# Patient Record
Sex: Female | Born: 1980 | Race: White | Hispanic: No | Marital: Single
Health system: Southern US, Community
[De-identification: ages and names within clinical notes are randomized; demographics above are authoritative.]

## PROBLEM LIST (undated history)

## (undated) DIAGNOSIS — E119 Type 2 diabetes mellitus without complications: Secondary | ICD-10-CM

## (undated) DIAGNOSIS — E669 Obesity, unspecified: Secondary | ICD-10-CM

## (undated) DIAGNOSIS — I10 Essential (primary) hypertension: Secondary | ICD-10-CM

## (undated) HISTORY — PX: TONSILLECTOMY: SUR1361

## (undated) HISTORY — PX: ADENOIDECTOMY: SUR15

---

## 2015-08-09 ENCOUNTER — Encounter: Payer: Self-pay | Admitting: Emergency Medicine

## 2015-08-09 ENCOUNTER — Ambulatory Visit: Payer: Medicare Other

## 2015-08-09 ENCOUNTER — Ambulatory Visit
Admission: EM | Admit: 2015-08-09 | Discharge: 2015-08-09 | Disposition: A | Discharge status: Home / Self Care | Payer: Medicare Other | Attending: Family Medicine | Admitting: Family Medicine

## 2015-08-09 DIAGNOSIS — Z7984 Long term (current) use of oral hypoglycemic drugs: Secondary | ICD-10-CM | POA: Diagnosis not present

## 2015-08-09 DIAGNOSIS — W19XXXA Unspecified fall, initial encounter: Secondary | ICD-10-CM | POA: Diagnosis not present

## 2015-08-09 DIAGNOSIS — S42202A Unspecified fracture of upper end of left humerus, initial encounter for closed fracture: Secondary | ICD-10-CM | POA: Diagnosis not present

## 2015-08-09 DIAGNOSIS — I10 Essential (primary) hypertension: Secondary | ICD-10-CM | POA: Insufficient documentation

## 2015-08-09 DIAGNOSIS — Z79899 Other long term (current) drug therapy: Secondary | ICD-10-CM | POA: Insufficient documentation

## 2015-08-09 DIAGNOSIS — E119 Type 2 diabetes mellitus without complications: Secondary | ICD-10-CM | POA: Insufficient documentation

## 2015-08-09 DIAGNOSIS — F819 Developmental disorder of scholastic skills, unspecified: Secondary | ICD-10-CM | POA: Insufficient documentation

## 2015-08-09 DIAGNOSIS — M79602 Pain in left arm: Secondary | ICD-10-CM | POA: Diagnosis present

## 2015-08-09 DIAGNOSIS — E669 Obesity, unspecified: Secondary | ICD-10-CM | POA: Insufficient documentation

## 2015-08-09 DIAGNOSIS — Z880 Allergy status to penicillin: Secondary | ICD-10-CM | POA: Diagnosis not present

## 2015-08-09 HISTORY — DX: Type 2 diabetes mellitus without complications: E11.9

## 2015-08-09 HISTORY — DX: Obesity, unspecified: E66.9

## 2015-08-09 HISTORY — DX: Essential (primary) hypertension: I10

## 2015-08-09 MED ORDER — HYDROCODONE-ACETAMINOPHEN 5-325 MG PO TABS: 1.0000 | ORAL_TABLET | Freq: Four times a day (QID) | ORAL | Status: AC | PRN

## 2015-08-09 NOTE — ED Provider Notes (Signed)
CSN: [id number]     Arrival date & time 08/09/15  1336 History   First MD Initiated Contact with Patient 08/09/15 1430     Chief Complaint  Patient presents with  . Shoulder Injury   (Consider location/radiation/quality/duration/timing/severity/associated sxs/prior Treatment) HPI Comments: 35 yo female with a h/o diabetes, learning disability presents with father with a complaint of left shoulder and left arm pain after falling at [facility] and landing on her left shoulder/arm. Patient states she lost her balance. She also hit her forehead against a wood counter, but denies any loss of consciousness, vision changes, vomiting. Denies any numbness or tingling of her left arm.  The history is provided by the patient.    Past Medical History  Diagnosis Date  . Diabetes mellitus without complication (HCC)   . Hypertension   . Obesity    Past Surgical History  Procedure Laterality Date  . Tonsillectomy    . Adenoidectomy     History reviewed. No pertinent family history. Social History  Substance Use Topics  . Smoking status: None  . Smokeless tobacco: None  . Alcohol Use: None   OB History    No data available     Review of Systems  Allergies  Penicillins  Home Medications   Prior to Admission medications   Medication Sig Start Date End Date Taking? Authorizing Provider  betamethasone dipropionate (DIPROLENE) 0.05 % cream Apply topically 2 (two) times daily.   Yes Historical Provider, MD  furosemide (LASIX) 20 MG tablet Take 20 mg by mouth daily.   Yes Historical Provider, MD  levonorgestrel-ethinyl estradiol (ENPRESSE,TRIVORA) tablet Take 1 tablet by mouth daily.   Yes Historical Provider, MD  lisinopril (PRINIVIL,ZESTRIL) 20 MG tablet Take 20 mg by mouth daily.   Yes Historical Provider, MD  metFORMIN (GLUCOPHAGE) 500 MG tablet Take 500 mg by mouth 3 (three) times daily.   Yes Historical Provider, MD  pioglitazone (ACTOS) 45 MG tablet Take 45 mg by mouth daily.   Yes  Historical Provider, MD  Potassium Chloride (KLOR-CON PO) Take 20 mEq by mouth 2 (two) times daily.   Yes Historical Provider, MD  HYDROcodone-acetaminophen (NORCO/VICODIN) 5-325 MG tablet Take 1-2 tablets by mouth every 6 (six) hours as needed. 08/09/15   Shelby Mccallum, MD   Meds Ordered and Administered this Visit  Medications - No data to display  BP 105/55 mmHg  Pulse 122  Temp(Src) 98.6 F (37 C) (Oral)  Ht  (1.27 m)  Wt 326 lb (147.873 kg)  BMI 91.68 kg/m2  SpO2 98%  LMP 08/02/2015 No data found.   Physical Exam  Constitutional: She is oriented to person, place, and time. She appears well-developed and well-nourished. No distress.  Musculoskeletal: She exhibits tenderness.       Left shoulder: She exhibits decreased range of motion, tenderness, bony tenderness (along the mid to proximal  humerus), swelling and pain. She exhibits no effusion, no crepitus, no deformity, no laceration, no spasm, normal pulse and normal strength.       Left elbow: She exhibits normal range of motion, no swelling, no effusion, no deformity and no laceration. Tenderness found.       Arms: Left upper extremity neurovascularly intact, including normal radial nerve examination  Neurological: She is alert and oriented to person, place, and time. She has normal reflexes. She displays normal reflexes. No cranial nerve deficit. She exhibits normal muscle tone. Coordination normal.  Skin: No rash noted. She is not diaphoretic.  Vitals reviewed.  ED Course  Procedures (including critical care time)  Labs Review Labs Reviewed - No data to display  Imaging Review Dg Elbow Complete Left  08/09/2015  CLINICAL DATA:  Recent fall with left arm pain, initial encounter EXAM: LEFT ELBOW - COMPLETE 3+ VIEW COMPARISON:  None. FINDINGS: There is no evidence of fracture, dislocation, or joint effusion. There is no evidence of arthropathy or other focal bone abnormality. Soft tissues are unremarkable.  IMPRESSION: No acute abnormality noted. Electronically Signed   By: Alcide CleverMark  Lukens M.D.   On: 08/09/2015 15:15   Dg Shoulder Left  08/09/2015  CLINICAL DATA:  Fall on left arm with pain EXAM: LEFT SHOULDER - 2+ VIEW COMPARISON:  None. FINDINGS: There is an oblique fracture through the proximal shaft of the left humerus just below the surgical neck. It may also the an undisplaced fracture through the greater tuberosity. No dislocation is seen. No other focal abnormality is noted. IMPRESSION: Oblique fracture through the proximal left humerus. Possible undisplaced greater tuberosity fracture is noted. Electronically Signed   By: Alcide CleverMark  Lukens M.D.   On: 08/09/2015 15:16     Visual Acuity Review  Right Eye Distance:   Left Eye Distance:   Bilateral Distance:    Right Eye Near:   Left Eye Near:    Bilateral Near:         MDM   1. Proximal humerus fracture, left, closed, initial encounter     Discharge Medication List as of 08/09/2015  4:24 PM    START taking these medications   Details  HYDROcodone-acetaminophen (NORCO/VICODIN) 5-325 MG tablet Take 1-2 tablets by mouth every 6 (six) hours as needed., Starting 08/09/2015, Until Discontinued, Print       1. x-ray results and diagnosis reviewed with patient and parent 2. rx as per orders above; reviewed possible side effects, interactions, risks and benefits  3. "sugar tong" splint applied to humerus and sling applied for immobilization and support 4. Recommend supportive treatment with otc analgesics 5. Follow-up with Dr. Joice LoftsPoggi [facility]( Orthopedics) in 3-4 days; Dr. Joice LoftsPoggi on call today and case discussed/reviewed with him through his assistant as Dr. Joice LoftsPoggi currently in surgery/OR  Shelby Mccallumrlando Jaun Galluzzo, MD 08/09/15 [phone]

## 2015-08-09 NOTE — Discharge Instructions (Signed)
Humerus Fracture Treated With Immobilization °The humerus is the large bone in your upper arm. You have a broken (fractured) humerus. These fractures are easily diagnosed with X-rays. °TREATMENT  °Simple fractures which will heal without disability are treated with simple immobilization. Immobilization means you will wear a cast, splint, or sling. You have a fracture which will do well with immobilization. The fracture will heal well simply by being held in a good position until it is stable enough to begin range of motion exercises. Do not take part in activities which would further injure your arm.  °HOME CARE INSTRUCTIONS  °· Put ice on the injured area. °¨ Put ice in a plastic bag. °¨ Place a towel between your skin and the bag. °¨ Leave the ice on for 15-20 minutes, 03-04 times a day. °· If you have a cast: °¨ Do not scratch the skin under the cast using sharp or pointed objects. °¨ Check the skin around the cast every day. You may put lotion on any red or sore areas. °¨ Keep your cast dry and clean. °· If you have a splint: °¨ Wear the splint as directed. °¨ Keep your splint dry and clean. °¨ You may loosen the elastic around the splint if your fingers become numb, tingle, or turn cold or blue. °· If you have a sling: °¨ Wear the sling as directed. °· Do not put pressure on any part of your cast or splint until it is fully hardened. °· Your cast or splint can be protected during bathing with a plastic bag. Do not lower the cast or splint into water. °· Only take over-the-counter or prescription medicines for pain, discomfort, or fever as directed by your caregiver. °· Do range of motion exercises as instructed by your caregiver. °· Follow up as directed by your caregiver. This is very important in order to avoid permanent injury or disability and chronic pain. °SEEK IMMEDIATE MEDICAL CARE IF:  °· Your skin or nails in the injured arm turn blue or gray. °· Your arm feels cold or numb. °· You develop severe pain  in the injured arm. °· You are having problems with the medicines you were given. °MAKE SURE YOU:  °· Understand these instructions. °· Will watch your condition. °· Will get help right away if you are not doing well or get worse. °  °This information is not intended to replace advice given to you by your health care provider. Make sure you discuss any questions you have with your health care provider. °  °Document Released: 06/07/2000 Document Revised: 03/22/2014 Document Reviewed: 07/24/2014 °Elsevier Interactive Patient Education ©2016 [employer]. ° °

## 2015-08-09 NOTE — ED Notes (Signed)
[facility]. Patient stated hit forehead and landed on left shoulder. Patient has lump on forehead and left shoulder pain. Patient stated pain in shoulder and unable to mobilze.Patinet denies head ache or dizziness.

## 2017-01-24 IMAGING — CR DG SHOULDER 2+V*L*
2 series · 2 of 2 positions shown · non-contrast
Comparison: None.

CLINICAL DATA: Fall on left arm with pain

EXAM:
LEFT SHOULDER - 2+ VIEW

[shoulder grashey]
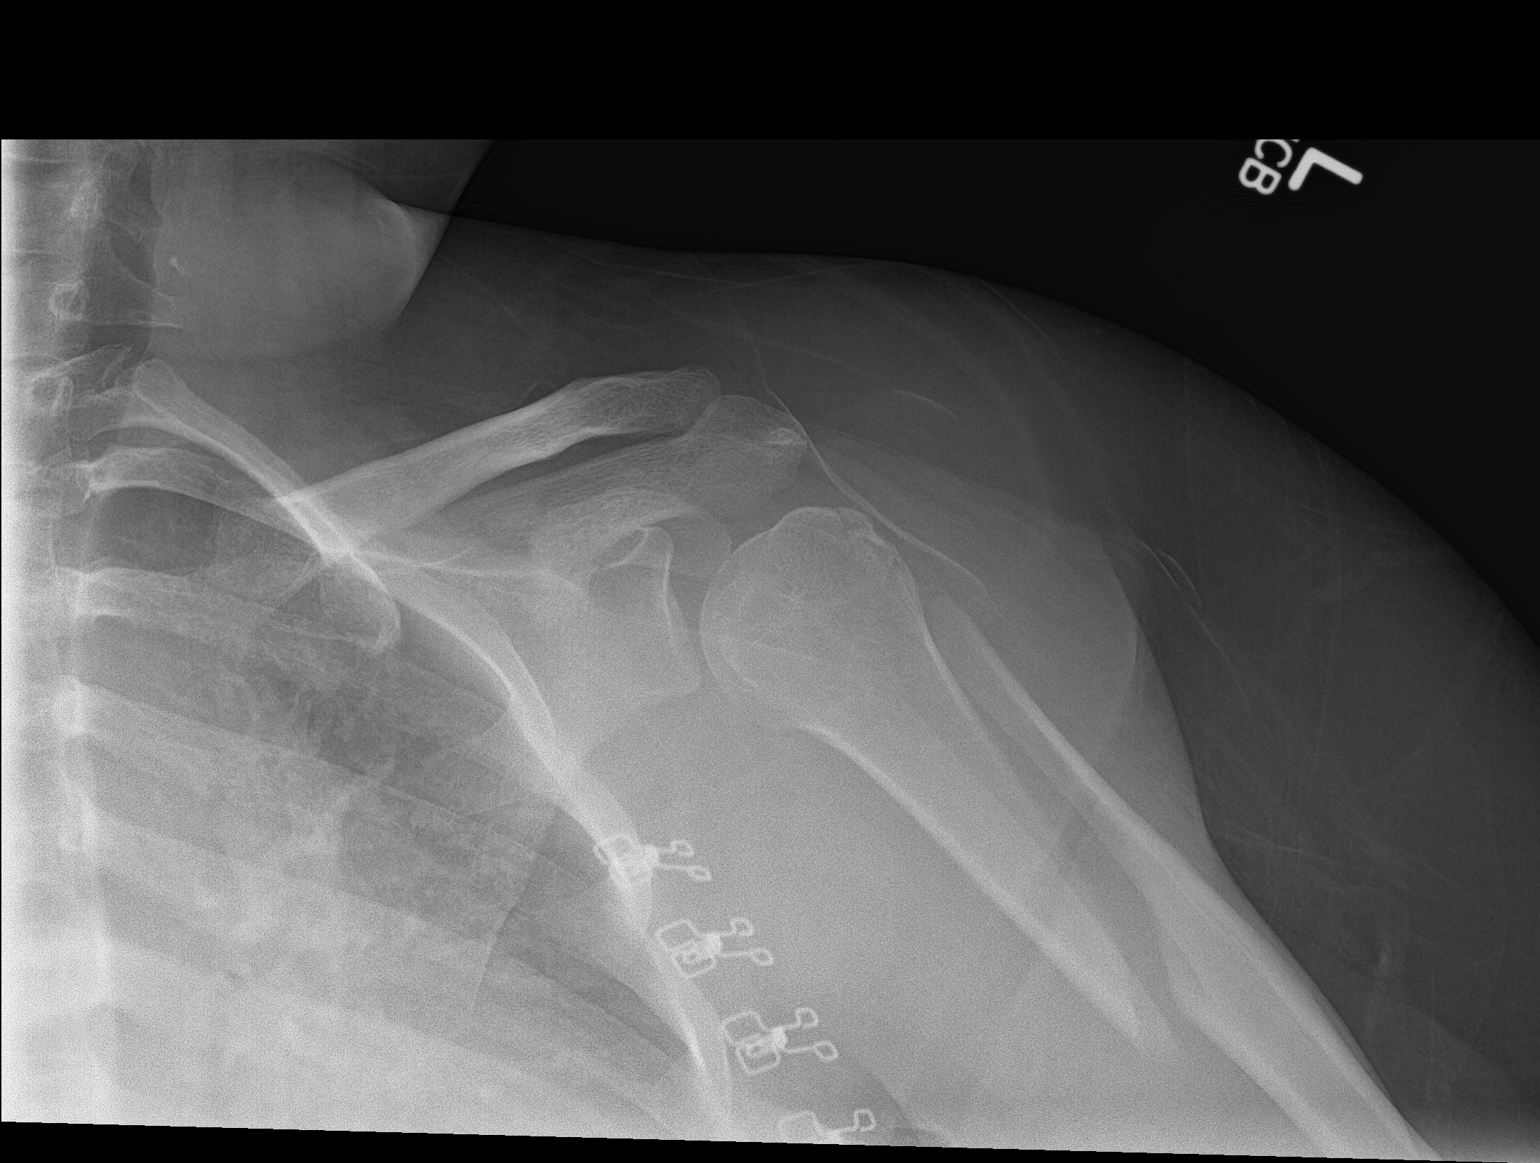

[shoulder y view]
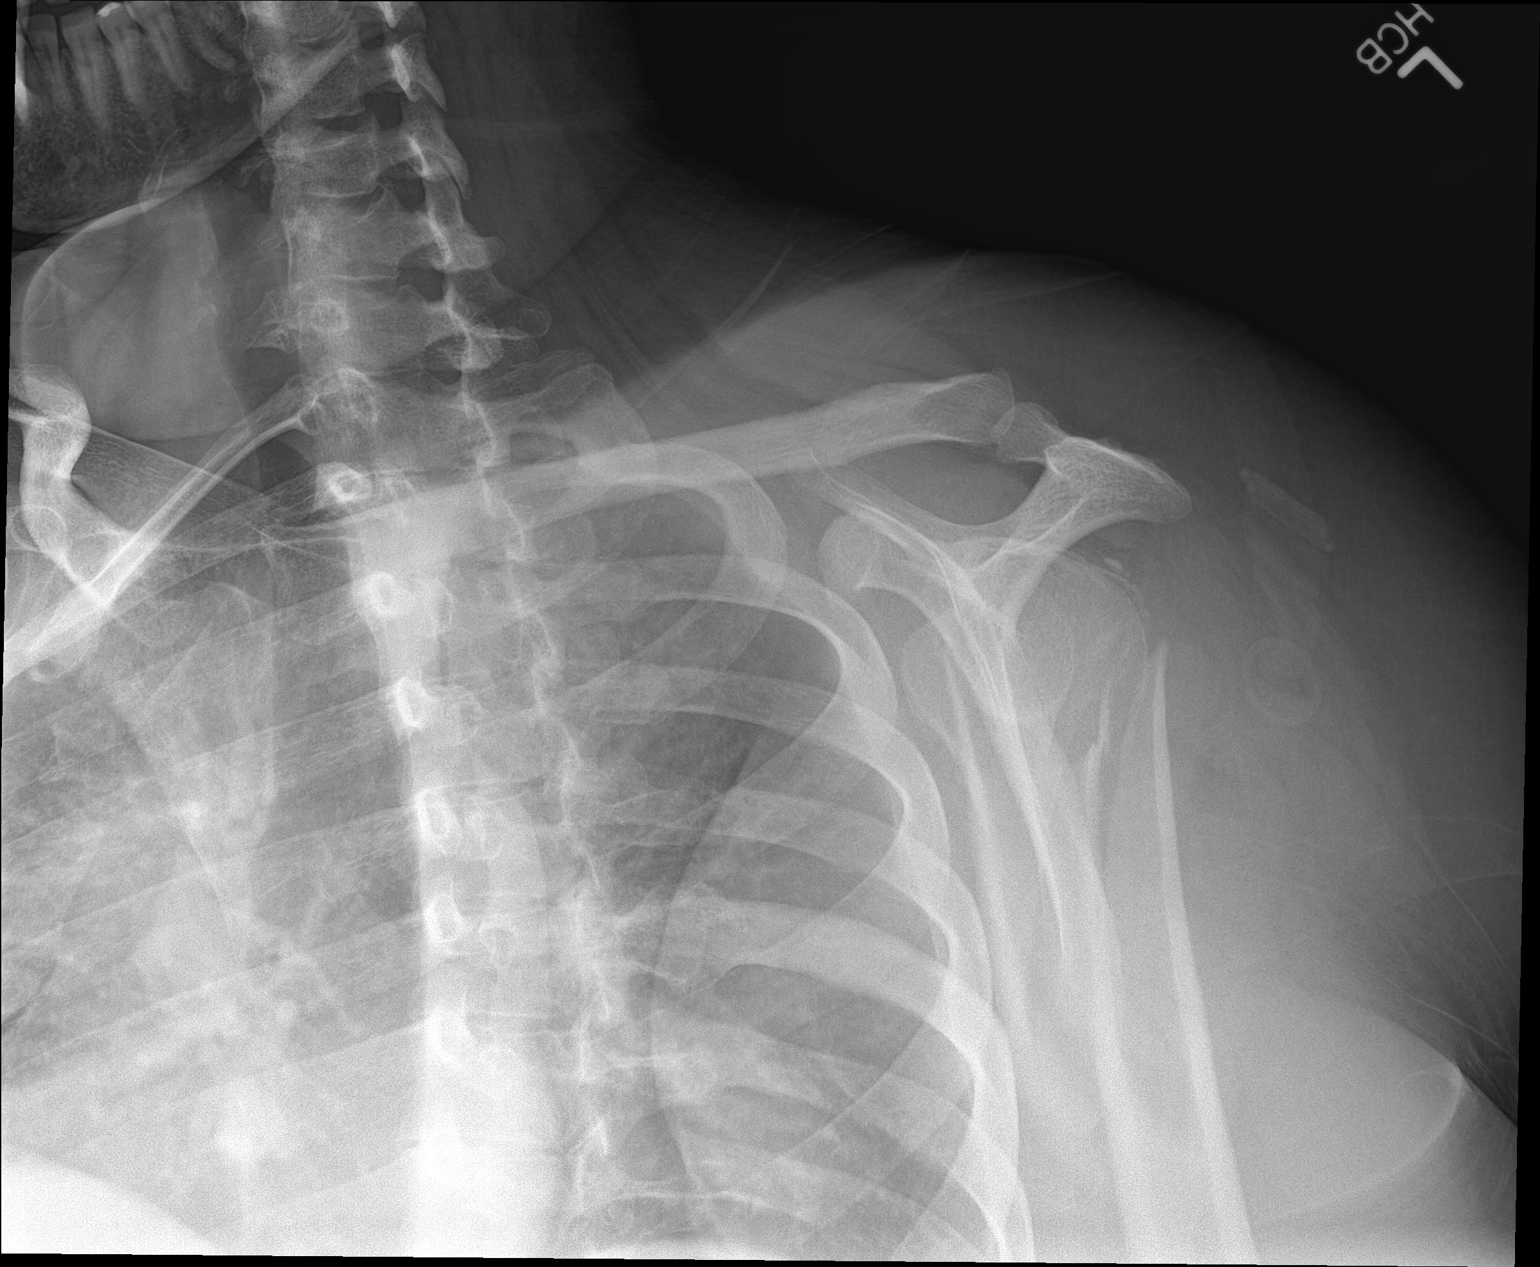

[2 of 2 positions shown; findings below may reference images not displayed]

FINDINGS: There is an oblique fracture through the proximal shaft of the left
humerus just below the surgical neck. It may also the an undisplaced
fracture through the greater tuberosity. No dislocation is seen. No
other focal abnormality is noted.
IMPRESSION: Oblique fracture through the proximal left humerus. Possible
undisplaced greater tuberosity fracture is noted.

## 2019-06-28 ENCOUNTER — Ambulatory Visit: Payer: Medicare Other

## 2019-07-27 ENCOUNTER — Ambulatory Visit: Payer: Medicare Other | Attending: Internal Medicine

## 2019-07-27 ENCOUNTER — Other Ambulatory Visit: Payer: Self-pay

## 2019-07-27 DIAGNOSIS — Z23 Encounter for immunization: Secondary | ICD-10-CM

## 2019-07-27 NOTE — Progress Notes (Signed)
   Covid-19 Vaccination Clinic  Name:  [name]    MRN: [id number] DOB: 02-03-1981  07/27/2019  [name] was observed post Covid-19 immunization for 15 minutes without incident. She was provided with Vaccine Information Sheet and instruction to access the V-Safe system.   [name] was instructed to call 911 with any severe reactions post vaccine: Marland Kitchen Difficulty breathing  . Swelling of face and throat  . A fast heartbeat  . A bad rash all over body  . Dizziness and weakness   Immunizations Administered    Name Date Dose VIS Date Route   Pfizer COVID-19 Vaccine 07/27/2019  9:30 AM 0.3 mL 05/09/2018 Intramuscular   Manufacturer: [employer], [employer]   Lot: [id number]   NDC: [address]-9

## 2019-08-21 ENCOUNTER — Ambulatory Visit: Payer: Medicare Other | Attending: Internal Medicine

## 2019-08-21 DIAGNOSIS — Z23 Encounter for immunization: Secondary | ICD-10-CM

## 2019-08-21 NOTE — Progress Notes (Signed)
   Covid-19 Vaccination Clinic  Name:  Shelby Bauer    MRN: [id number] DOB: 08-08-1980  08/21/2019  [name] was observed post Covid-19 immunization for 15 minutes without incident. She was provided with Vaccine Information Sheet and instruction to access the V-Safe system.   [name] was instructed to call 911 with any severe reactions post vaccine: Marland Kitchen Difficulty breathing  . Swelling of face and throat  . A fast heartbeat  . A bad rash all over body  . Dizziness and weakness   Immunizations Administered    Name Date Dose VIS Date Route   Pfizer COVID-19 Vaccine 08/21/2019  9:31 AM 0.3 mL 05/09/2018 Intramuscular   Manufacturer: [employer], [employer]   Lot: [id number]   NDC: [address]-1

## 2022-02-03 ENCOUNTER — Other Ambulatory Visit
Admission: RE | Admit: 2022-02-03 | Discharge: 2022-02-03 | Disposition: A | Discharge status: Home / Self Care | Payer: Medicare Other | Source: Ambulatory Visit | Attending: Family Medicine | Admitting: Family Medicine

## 2022-02-03 DIAGNOSIS — M79605 Pain in left leg: Secondary | ICD-10-CM | POA: Insufficient documentation

## 2022-02-03 DIAGNOSIS — Z23 Encounter for immunization: Secondary | ICD-10-CM | POA: Diagnosis present

## 2022-02-03 DIAGNOSIS — E119 Type 2 diabetes mellitus without complications: Secondary | ICD-10-CM | POA: Insufficient documentation

## 2022-02-03 LAB — D-DIMER, QUANTITATIVE: D-Dimer, Quant: 0.6 ug/mL-FEU — ABNORMAL HIGH (ref 0.00–0.50)

## 2022-06-11 ENCOUNTER — Emergency Department: Payer: Medicare Other

## 2022-06-11 ENCOUNTER — Emergency Department
Admission: EM | Admit: 2022-06-11 | Discharge: 2022-06-11 | Disposition: A | Discharge status: Other Acute Inpatient Hospital | Payer: Medicare Other | Attending: Emergency Medicine | Admitting: Emergency Medicine

## 2022-06-11 ENCOUNTER — Other Ambulatory Visit: Payer: Self-pay

## 2022-06-11 DIAGNOSIS — K420 Umbilical hernia with obstruction, without gangrene: Secondary | ICD-10-CM

## 2022-06-11 DIAGNOSIS — I10 Essential (primary) hypertension: Secondary | ICD-10-CM | POA: Insufficient documentation

## 2022-06-11 DIAGNOSIS — E119 Type 2 diabetes mellitus without complications: Secondary | ICD-10-CM | POA: Diagnosis not present

## 2022-06-11 DIAGNOSIS — K56609 Unspecified intestinal obstruction, unspecified as to partial versus complete obstruction: Secondary | ICD-10-CM | POA: Insufficient documentation

## 2022-06-11 DIAGNOSIS — Z6841 Body Mass Index (BMI) 40.0 and over, adult: Secondary | ICD-10-CM | POA: Diagnosis not present

## 2022-06-11 DIAGNOSIS — R112 Nausea with vomiting, unspecified: Secondary | ICD-10-CM | POA: Diagnosis present

## 2022-06-11 LAB — COMPREHENSIVE METABOLIC PANEL
ALT: 99 U/L — ABNORMAL HIGH (ref 0–44)
AST: 86 U/L — ABNORMAL HIGH (ref 15–41)
Albumin: 4 g/dL (ref 3.5–5.0)
Alkaline Phosphatase: 85 U/L (ref 38–126)
Anion gap: 18 — ABNORMAL HIGH (ref 5–15)
BUN: 21 mg/dL — ABNORMAL HIGH (ref 6–20)
CO2: 26 mmol/L (ref 22–32)
Calcium: 9.8 mg/dL (ref 8.9–10.3)
Chloride: 86 mmol/L — ABNORMAL LOW (ref 98–111)
Creatinine, Ser: 0.99 mg/dL (ref 0.44–1.00)
GFR, Estimated: >60 mL/min (ref >60)
Glucose, Bld: 169 mg/dL — ABNORMAL HIGH (ref 70–99)
Potassium: 3.7 mmol/L (ref 3.5–5.1)
Sodium: 130 mmol/L — ABNORMAL LOW (ref 135–145)
Total Bilirubin: 1.1 mg/dL (ref 0.3–1.2)
Total Protein: 7.8 g/dL (ref 6.5–8.1)

## 2022-06-11 LAB — CBC WITH DIFFERENTIAL/PLATELET
Abs Immature Granulocytes: 0.06 10*3/uL (ref 0.00–0.07)
Basophils Absolute: 0 10*3/uL (ref 0.0–0.1)
Basophils Relative: 0 %
Eosinophils Absolute: 0.1 10*3/uL (ref 0.0–0.5)
Eosinophils Relative: 1 %
HCT: 50.3 % — ABNORMAL HIGH (ref 36.0–46.0)
Hemoglobin: 16 g/dL — ABNORMAL HIGH (ref 12.0–15.0)
Immature Granulocytes: 0 %
Lymphocytes Relative: 15 %
Lymphs Abs: 2 10*3/uL (ref 0.7–4.0)
MCH: 26.1 pg (ref 26.0–34.0)
MCHC: 31.8 g/dL (ref 30.0–36.0)
MCV: 82.2 fL (ref 80.0–100.0)
Monocytes Absolute: 1.2 10*3/uL — ABNORMAL HIGH (ref 0.1–1.0)
Monocytes Relative: 9 %
Neutro Abs: 10.2 10*3/uL — ABNORMAL HIGH (ref 1.7–7.7)
Neutrophils Relative %: 75 %
Platelets: 638 10*3/uL — ABNORMAL HIGH (ref 150–400)
RBC: 6.12 MIL/uL — ABNORMAL HIGH (ref 3.87–5.11)
RDW: 14.6 % (ref 11.5–15.5)
WBC: 13.5 10*3/uL — ABNORMAL HIGH (ref 4.0–10.5)
nRBC: 0 % (ref 0.0–0.2)

## 2022-06-11 LAB — HCG, QUANTITATIVE, PREGNANCY: hCG, Beta Chain, Quant, S: <1 m[IU]/mL (ref <5)

## 2022-06-11 LAB — LIPASE, BLOOD: Lipase: 26 U/L (ref 11–51)

## 2022-06-11 MED ORDER — SODIUM CHLORIDE 0.9 % IV BOLUS
1000.0000 mL | Freq: Once | INTRAVENOUS | Status: AC
  Administered 2022-06-11: 1000 mL via INTRAVENOUS

## 2022-06-11 MED ORDER — IOHEXOL 300 MG/ML  SOLN
100.0000 mL | Freq: Once | INTRAMUSCULAR | Status: AC | PRN
  Administered 2022-06-11: 100 mL via INTRAVENOUS

## 2022-06-11 NOTE — ED Provider Notes (Signed)
[facility] Provider Note  Patient Contact: 6:02 PM (approximate)   History   Emesis   HPI  Shelby Bauer is a 42 y.o. female with a history of diabetes, hypertension and obesity, presents to the emergency department with nausea and vomiting for the past 5 days.  She states that she has not been able to keep anything down by mouth.  She denies fever and chills.  No prior abdominal surgeries.  She denies chest pain, chest tightness or shortness of breath.      Physical Exam   Triage Vital Signs: ED Triage Vitals  Enc Vitals Group     BP 06/11/22 1517 129/75     Pulse Rate 06/11/22 1517 (!) 120     Resp 06/11/22 1517 20     Temp 06/11/22 1517 98 F (36.7 C)     Temp src --      SpO2 06/11/22 1517 95 %     Weight 06/11/22 1518 (!) 347 lb (157.4 kg)     Height --      Head Circumference --      Peak Flow --      Pain Score 06/11/22 1518 6     Pain Loc --      Pain Edu? --      Excl. in [place]? --     Most recent vital signs: Vitals:   06/11/22 1517  BP: 129/75  Pulse: (!) 120  Resp: 20  Temp: 98 F (36.7 C)  SpO2: 95%     General: Alert and in no acute distress. Eyes:  PERRL. EOMI. Head: No acute traumatic findings ENT:      Nose: No congestion/rhinnorhea.      Mouth/Throat: Mucous membranes are moist. Neck: No stridor. No cervical spine tenderness to palpation. Hematological/Lymphatic/Immunilogical: No cervical lymphadenopathy. Cardiovascular:  Good peripheral perfusion Respiratory: Normal respiratory effort without tachypnea or retractions. Lungs CTAB. Good air entry to the bases with no decreased or absent breath sounds. Gastrointestinal: Bowel sounds 4 quadrants. Soft and nontender to palpation. No guarding or rigidity. No palpable masses. No distention. No CVA tenderness. Musculoskeletal: Full range of motion to all extremities.  Neurologic:  No gross focal neurologic deficits are appreciated.  Skin:   No rash  noted    ED Results / Procedures / Treatments   Labs (all labs ordered are listed, but only abnormal results are displayed) Labs Reviewed  CBC WITH DIFFERENTIAL/PLATELET - Abnormal; Notable for the following components:      Result Value   WBC 13.5 (*)    RBC 6.12 (*)    Hemoglobin 16.0 (*)    HCT 50.3 (*)    Platelets 638 (*)    Neutro Abs 10.2 (*)    Monocytes Absolute 1.2 (*)    All other components within normal limits  COMPREHENSIVE METABOLIC PANEL - Abnormal; Notable for the following components:   Sodium 130 (*)    Chloride 86 (*)    Glucose, Bld 169 (*)    BUN 21 (*)    AST 86 (*)    ALT 99 (*)    Anion gap 18 (*)    All other components within normal limits  LIPASE, BLOOD  HCG, QUANTITATIVE, PREGNANCY  URINALYSIS, ROUTINE W REFLEX MICROSCOPIC  POC URINE PREG, ED        RADIOLOGY  I personally viewed and evaluated these images as part of my medical decision making, as well as reviewing the written report by the radiologist.  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  Fluid and edema within the perienteric hernia sac suggest possible incarceration.    PROCEDURES:  Critical Care performed: No  Procedures   MEDICATIONS ORDERED IN ED: Medications  sodium chloride 0.9 % bolus 1,000 mL (1,000 mLs Intravenous New Bag/Given 06/11/22 1701)  iohexol (OMNIPAQUE) 300 MG/ML solution 100 mL (100 mLs Intravenous Contrast Given 06/11/22 1710)     IMPRESSION / MDM / ASSESSMENT AND PLAN / ED COURSE  I reviewed the triage vital signs and the nursing notes.                              Assessment and plan: Bowel obstruction 41 year old female presents to the emergency department with nausea and vomiting for the past 5 days and inability to tolerate p.o.  Patient was tachycardic at triage and tachypneic.  Vital signs otherwise reassuring.  On  exam, patient was alert and nontoxic-appearing.  Abdomen was soft and nontender without guarding and patient had no palpable hernias.  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  I reached out to general surgeon on-call, Dr. Christian Mate who recommended NG tube placement and personally evaluated patient at bedside.  Given patient's body habitus, Dr. Luther Bradley was concerned for complications from surgery and recommended transfer to [facility].  I spoke with [facility] general surgeon, Dr. Kinnie Scales who accepted patient for transfer.  Family agrees to care plan.      FINAL CLINICAL IMPRESSION(S) / ED DIAGNOSES   Final diagnoses:  Small bowel obstruction ([place])     Rx / DC Orders   ED Discharge Orders     None        Note:  This document was prepared using Dragon voice recognition software and may include unintentional dictation errors.   Vallarie Mare [name], PA-C 06/11/22 2025    Rada Hay, MD 06/12/22 [phone]

## 2022-06-11 NOTE — ED Notes (Signed)
This RN, gave report to [place] with [facility], transport ETA approximately 15 minutes out at this time. The pt has been updated on the ETA.

## 2022-06-11 NOTE — ED Triage Notes (Signed)
Pt to ED for emesis for past 4 days. Was seen at [facility] yesterday, received labs and abd XRAY. Was sent to ER for possible SBO. No orders at this time per dr bradler

## 2022-06-11 NOTE — ED Notes (Signed)
EMTALA reviewed by this RN.  

## 2022-06-11 NOTE — Consult Note (Signed)
[facility] SURGICAL CONSULTATION NOTE (initial) - cpt: 99244(Outpatient/ED)   HISTORY OF PRESENT ILLNESS (HPI):  42 y.o. female presented to [facility] ED today for evaluation of SBO. Patient reports a 4 day h/o nausea and vomiting.  Disabled from reported learning disability.  Presents with her father who is also disabled.   Pt denies f/c, diarrhea or constipation.     Surgery is consulted by ED provider Vallarie Mare, PA-C.  in this context for evaluation and management of SBO with incarcerated umbilical hernia.  PAST MEDICAL HISTORY (PMH):  Past Medical History:  Diagnosis Date   Diabetes mellitus without complication ([place])    Hypertension    Obesity      PAST SURGICAL HISTORY ([place]):  Past Surgical History:  Procedure Laterality Date   ADENOIDECTOMY     TONSILLECTOMY       MEDICATIONS:  Prior to Admission medications   Medication Sig Start Date End Date Taking? Authorizing Provider  betamethasone dipropionate (DIPROLENE) 0.05 % cream Apply topically 2 (two) times daily.    [provider]  furosemide (LASIX) 20 MG tablet Take 20 mg by mouth daily.    [provider]  HYDROcodone-acetaminophen (NORCO/VICODIN) 5-325 MG tablet Take 1-2 tablets by mouth every 6 (six) hours as needed. 08/09/15   Norval Gable, MD  levonorgestrel-ethinyl estradiol (ENPRESSE,TRIVORA) tablet Take 1 tablet by mouth daily.    [provider]  lisinopril (PRINIVIL,ZESTRIL) 20 MG tablet Take 20 mg by mouth daily.    [provider]  metFORMIN (GLUCOPHAGE) 500 MG tablet Take 500 mg by mouth 3 (three) times daily.    [provider]  pioglitazone (ACTOS) 45 MG tablet Take 45 mg by mouth daily.    [provider]  Potassium Chloride (KLOR-CON PO) Take 20 mEq by mouth 2 (two) times daily.    [provider]     ALLERGIES:  Allergies  Allergen Reactions   Penicillins      SOCIAL HISTORY:  Social History   Socioeconomic History    Marital status: Single    Spouse name: Not on file   Number of children: Not on file   Years of education: Not on file   Highest education level: Not on file  Occupational History   Not on file  Tobacco Use   Smoking status: Not on file   Smokeless tobacco: Not on file  Substance and Sexual Activity   Alcohol use: Not on file   Drug use: Not on file   Sexual activity: Not on file  Other Topics Concern   Not on file  Social History Narrative   Not on file   Social Determinants of Health   Financial Resource Strain: Not on file  Food Insecurity: Not on file  Transportation Needs: Not on file  Physical Activity: Not on file  Stress: Not on file  Social Connections: Not on file  Intimate Partner Violence: Not on file     FAMILY HISTORY:  No family history on file.    REVIEW OF SYSTEMS:  Review of Systems  Unable to perform ROS: Mental acuity    VITAL SIGNS:  Temp:  [98 F (36.7 C)] 98 F (36.7 C) (03/29 1517) Pulse Rate:  [120] 120 (03/29 1517) Resp:  [20] 20 (03/29 1517) BP: (129)/(75) 129/75 (03/29 1517) SpO2:  [95 %] 95 % (03/29 1517) Weight:  [157.4 kg] 157.4 kg (03/29 1914)     Height: 4\' 2"  (127 cm) Weight: (!) 157.4 kg BMI (Calculated): 97.59  INTAKE/OUTPUT:  No intake/output data recorded.  PHYSICAL EXAM:  Physical Exam Blood pressure 129/75, pulse (!) 120, temperature 98 F (36.7 C), resp. rate 20, height 4\' 2"  (1.27 m), weight (!) 157.4 kg, SpO2 95 %. Last Weight  Most recent update: 06/11/2022  7:14 PM    Weight  157.4 kg (347 lb)               CONSTITUTIONAL: Well beyond morbidly obese, appropriately responsive and aware without distress.  Initially wouldn't speak to me for the NGT irritating her throat.  EYES: Sclera non-icteric.   EARS, NOSE, MOUTH AND THROAT: NGT secured, bilious output, a cannister. Oral mucosa is pink and moist. Hearing intact to voice.  NECK: Trachea is midline, and there is no jugular venous distension.  LYMPH  NODES:  Lymph nodes in the neck are not appreciable. RESPIRATORY:   Normal respiratory effort without pathologic use of accessory muscles. CARDIOVASCULAR: Heart is regular in rate and rhythm.   Well perfused.  GI: The abdomen is markedly obese, with a midline mass irreducible, o/w soft, & nontender. There were no other palpable masses. I could not appreciate hepatosplenomegaly. MUSCULOSKELETAL:  Warm, well  perfused.  SKIN: Skin turgor is normal. No pathologic skin lesions appreciated.  NEUROLOGIC:  Motor and sensation appear grossly normal.  Cranial nerves are grossly without defect. PSYCH:  Alert and oriented to person, place and time. Affect is appropriate for situation.  Showing concern that her father get home safely.   Data Reviewed I have personally reviewed what is currently available of the patient's imaging, recent labs and medical records.    Labs:     Latest Ref Rng & Units 06/11/2022    4:04 PM  CBC  WBC 4.0 - 10.5 K/uL 13.5   Hemoglobin 12.0 - 15.0 g/dL 16.0   Hematocrit 36.0 - 46.0 % 50.3   Platelets 150 - 400 K/uL 638       Latest Ref Rng & Units 06/11/2022    4:04 PM  CMP  Glucose 70 - 99 mg/dL 169   BUN 6 - 20 mg/dL 21   Creatinine 0.44 - 1.00 mg/dL 0.99   Sodium 135 - 145 mmol/L 130   Potassium 3.5 - 5.1 mmol/L 3.7   Chloride 98 - 111 mmol/L 86   CO2 22 - 32 mmol/L 26   Calcium 8.9 - 10.3 mg/dL 9.8   Total Protein 6.5 - 8.1 g/dL 7.8   Total Bilirubin 0.3 - 1.2 mg/dL 1.1   Alkaline Phos 38 - 126 U/L 85   AST 15 - 41 U/L 86   ALT 0 - 44 U/L 99      Imaging studies:   Last 24 hrs: DG Abd Portable 1 View  Result Date: 06/11/2022 CLINICAL DATA:  NG tube placement EXAM: PORTABLE ABDOMEN - 1 VIEW COMPARISON:  CT head 06/11/2022 FINDINGS: Esophageal tube tip overlies the stomach. Atelectasis at the left base. Dilated small bowel in the upper abdomen IMPRESSION: Esophageal tube tip overlies the stomach. Electronically Signed   By: Donavan Foil M.D.   On:  06/11/2022 19:11   CT ABDOMEN PELVIS W CONTRAST  Result Date: 06/11/2022 CLINICAL DATA:  Emesis for 4 days.  Bowel obstruction suspected. EXAM: CT ABDOMEN AND PELVIS WITH CONTRAST TECHNIQUE: Multidetector CT imaging of the abdomen and pelvis was performed using the standard protocol following bolus administration of intravenous contrast. RADIATION DOSE REDUCTION: This exam was performed according to the departmental dose-optimization program which includes automated exposure control, adjustment  of the mA and/or kV according to patient size and/or use of iterative reconstruction technique. CONTRAST:  142mL OMNIPAQUE IOHEXOL 300 MG/ML  SOLN COMPARISON:  Report of plain film of 1 day prior FINDINGS: Exam is mildly degraded by patient body habitus. Lower chest: Clear lung bases. Normal heart size without pericardial or pleural effusion. Hepatobiliary: Hepatic steatosis is moderate. Hepatomegaly at 18.5 cm craniocaudal. Minimal motion degradation in the upper abdomen. Dependent gallstones without acute cholecystitis or biliary duct dilatation. Pancreas: Normal, without mass or ductal dilatation. Spleen: Insert splayed Adrenals/Urinary Tract: Normal adrenal glands. Normal kidneys, without hydronephrosis. Normal urinary bladder. Stomach/Bowel: Normal stomach, without wall thickening. The colon is decompressed. Nonobstructive transverse colon positioned within the below described ventral abdominal wall hernia. Normal terminal ileum. Appendix not visualized. Proximal and mid small bowel dilatation. This continues to the level of a ventral abdominopelvic wall hernia. In addition to obstructive small bowel, there is perienteric fluid and edema within the hernia including on 82/2. No pneumatosis or free intraperitoneal air. Vascular/Lymphatic: Normal caliber of the aorta and branch vessels. No abdominal adenopathy. Prominent inguinal and pelvic sidewall nodes are typical in the setting of obesity. Reproductive: Normal  uterus and adnexa. Other: No intraabdominal/pelvic fluid. Musculoskeletal: No acute osseous abnormality. IMPRESSION: 1. Motion and patient body habitus degraded exam. 2. High-grade small-bowel obstruction secondary to a large and small bowel containing ventral abdominopelvic wall hernia. Fluid and edema within the perienteric hernia sac for which incarceration cannot be excluded. 3. Hepatic steatosis and hepatomegaly 4. Cholelithiasis Electronically Signed   By: Abigail Miyamoto M.D.   On: 06/11/2022 17:34      Assessment/Plan:  42 y.o. female with severe obesity, complicated by pertinent comorbidities including HTN and DM 2. A minimally invasive approach to this pt along with additional peri-operative care would be in her best interest.    All of the above findings and recommendations were discussed with the patient and her father, and all of patient's and father's questions were answered to their expressed satisfaction.   Dr. Kinnie Scales has accepted and is the receiving/accepting Surgeon for transfer to [facility].   Thank you for the opportunity to participate in this patient's care.   -- Ronny Bacon, M.D., FACS 06/11/2022, 7:31 PM

## 2022-06-11 NOTE — ED Notes (Signed)
[facility]  CALLED  PER  [facility]  PA

## 2022-06-11 NOTE — ED Notes (Signed)
Shelby Bauer

## 2022-06-11 NOTE — ED Notes (Signed)
[facility] called for transport to [facility] ED

## 2023-04-26 ENCOUNTER — Other Ambulatory Visit: Payer: Self-pay | Admitting: Family Medicine

## 2023-04-26 DIAGNOSIS — Z1231 Encounter for screening mammogram for malignant neoplasm of breast: Secondary | ICD-10-CM

## 2023-05-10 ENCOUNTER — Ambulatory Visit
Admission: RE | Admit: 2023-05-10 | Discharge: 2023-05-10 | Disposition: A | Discharge status: Home / Self Care | Payer: Medicare Other | Source: Ambulatory Visit | Attending: Family Medicine | Admitting: Family Medicine

## 2023-05-10 DIAGNOSIS — Z1231 Encounter for screening mammogram for malignant neoplasm of breast: Secondary | ICD-10-CM | POA: Diagnosis present
